# Patient Record
Sex: Male | Born: 1995 | Race: Black or African American | Hispanic: No | Marital: Single | Smoking: Never smoker
Health system: Southern US, Community
[De-identification: ages and names within clinical notes are randomized; demographics above are authoritative.]

---
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
## 2016-08-24 ENCOUNTER — Emergency Department
Admission: EM | Admit: 2016-08-24 | Discharge: 2016-08-24 | Disposition: A | Discharge status: Home / Self Care | Payer: BLUE CROSS/BLUE SHIELD | Attending: Emergency Medicine | Admitting: Emergency Medicine

## 2016-08-24 ENCOUNTER — Encounter: Payer: Self-pay | Admitting: *Deleted

## 2016-08-24 DIAGNOSIS — Y9389 Activity, other specified: Secondary | ICD-10-CM | POA: Insufficient documentation

## 2016-08-24 DIAGNOSIS — M791 Myalgia: Secondary | ICD-10-CM | POA: Diagnosis not present

## 2016-08-24 DIAGNOSIS — Y999 Unspecified external cause status: Secondary | ICD-10-CM | POA: Diagnosis not present

## 2016-08-24 DIAGNOSIS — Y92410 Unspecified street and highway as the place of occurrence of the external cause: Secondary | ICD-10-CM | POA: Insufficient documentation

## 2016-08-24 DIAGNOSIS — M7918 Myalgia, other site: Secondary | ICD-10-CM

## 2016-08-24 DIAGNOSIS — Z041 Encounter for examination and observation following transport accident: Secondary | ICD-10-CM | POA: Diagnosis present

## 2016-08-24 MED ORDER — CYCLOBENZAPRINE HCL 5 MG PO TABS: 5.0000 mg | ORAL_TABLET | Freq: Three times a day (TID) | ORAL | 0 refills | Status: DC | PRN

## 2016-08-24 MED ORDER — NAPROXEN 500 MG PO TBEC: 500.0000 mg | DELAYED_RELEASE_TABLET | Freq: Two times a day (BID) | ORAL | 0 refills | Status: DC

## 2016-08-24 NOTE — Discharge Instructions (Signed)
Your exam is essentially normal at this time. Take the prescription meds as needed. Follow-up with [facility] or your provider for ongoing symptoms.
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
## 2017-03-14 ENCOUNTER — Ambulatory Visit
Admission: EM | Admit: 2017-03-14 | Discharge: 2017-03-14 | Discharge status: Absent without leave | Payer: BLUE CROSS/BLUE SHIELD

## 2018-01-31 ENCOUNTER — Encounter: Payer: Self-pay | Admitting: Emergency Medicine

## 2018-01-31 ENCOUNTER — Emergency Department
Admission: EM | Admit: 2018-01-31 | Discharge: 2018-01-31 | Disposition: A | Discharge status: Home / Self Care | Payer: BLUE CROSS/BLUE SHIELD | Attending: Emergency Medicine | Admitting: Emergency Medicine

## 2018-01-31 DIAGNOSIS — L03012 Cellulitis of left finger: Secondary | ICD-10-CM | POA: Diagnosis not present

## 2018-01-31 DIAGNOSIS — M79642 Pain in left hand: Secondary | ICD-10-CM | POA: Diagnosis present

## 2018-01-31 MED ORDER — PENTAFLUOROPROP-TETRAFLUOROETH EX AERO
INHALATION_SPRAY | CUTANEOUS | Status: AC
  Filled 2018-01-31: qty 30

## 2018-01-31 MED ORDER — PENTAFLUOROPROP-TETRAFLUOROETH EX AERO
INHALATION_SPRAY | Freq: Once | CUTANEOUS | Status: AC
  Administered 2018-01-31: 30 via TOPICAL

## 2018-01-31 MED ORDER — SULFAMETHOXAZOLE-TRIMETHOPRIM 800-160 MG PO TABS: 1.0000 | ORAL_TABLET | Freq: Two times a day (BID) | ORAL | 0 refills | Status: DC

## 2018-01-31 MED ORDER — SULFAMETHOXAZOLE-TRIMETHOPRIM 800-160 MG PO TABS
1.0000 | ORAL_TABLET | Freq: Once | ORAL | Status: AC
  Administered 2018-01-31: 1 via ORAL
  Filled 2018-01-31: qty 1

## 2018-01-31 NOTE — ED Notes (Signed)
Provider at bedside at this time

## 2018-01-31 NOTE — ED Notes (Signed)
PA at bedside for procedure.

## 2018-01-31 NOTE — ED Triage Notes (Signed)
Patient presents to the ED with swelling around the nailbed of his middle finger on his left hand.  Patient states it began to hurt several days ago but did not swell until today.  Patient denies any other complaints at this time.

## 2018-01-31 NOTE — ED Provider Notes (Signed)
[facility] Emergency Department Provider Note  ____________________________________________  Time seen: Approximately 10:20 PM  I have reviewed the triage vital signs and the nursing notes.   HISTORY  Chief Complaint Hand Pain    HPI Sean Herman is a 22 y.o. male who presents the emergency department complaining of pain and swelling to the medial nailbed of the third digit left hand.  Patient reports that he does bite his fingernails, started to experience pain along the medial nailbed.  Today patient had noticeable erythema, what appeared like pus next to the nailbed.  No drainage from site.  Patient reports that pain and swelling is localized only to medial nailbed.  He is able to flex and extend the digit appropriately.  No other injury or complaint.  No medications prior to arrival.    History reviewed. No pertinent past medical history.  There are no active problems to display for this patient.   History reviewed. No pertinent surgical history.  Prior to Admission medications   Medication Sig Start Date End Date Taking? Authorizing Provider  cyclobenzaprine (FLEXERIL) 5 MG tablet Take 1 tablet (5 mg total) by mouth 3 (three) times daily as needed for muscle spasms. 08/24/16   Menshew, Charlesetta Ivory, PA-C  naproxen (EC NAPROSYN) 500 MG EC tablet Take 1 tablet (500 mg total) by mouth 2 (two) times daily with a meal. 08/24/16   Menshew, Charlesetta Ivory, PA-C  sulfamethoxazole-trimethoprim (BACTRIM DS,SEPTRA DS) 800-160 MG tablet Take 1 tablet by mouth 2 (two) times daily. 01/31/18   Lennan Malone, Delorise Royals, PA-C    Allergies Patient has no known allergies.  No family history on file.  Social History Social History   Tobacco Use  . Smoking status: Never Smoker  . Smokeless tobacco: Never Used  Substance Use Topics  . Alcohol use: No  . Drug use: Not on file     Review of Systems  Constitutional: No fever/chills Eyes: No visual changes. No  discharge ENT: No upper respiratory complaints. Cardiovascular: no chest pain. Respiratory: no cough. No SOB. Gastrointestinal: No abdominal pain.  No nausea, no vomiting.  No diarrhea.  No constipation. Musculoskeletal: Negative for musculoskeletal pain. Skin: Positive for pain, swelling, possible medial nailbed third digit left hand Neurological: Negative for headaches, focal weakness or numbness. 10-point ROS otherwise negative.  ____________________________________________   PHYSICAL EXAM:  VITAL SIGNS: ED Triage Vitals  Enc Vitals Group     BP 01/31/18 2108 (!) 154/81     Pulse Rate 01/31/18 2108 74     Resp 01/31/18 2108 18     Temp 01/31/18 2108 (!) 97.5 F (36.4 C)     Temp Source 01/31/18 2108 Oral     SpO2 01/31/18 2108 96 %     Weight 01/31/18 2105 218 lb (98.9 kg)     Height 01/31/18 2105 6\' 1"  (1.854 m)     Head Circumference --      Peak Flow --      Pain Score 01/31/18 2105 8     Pain Loc --      Pain Edu? --      Excl. in GC? --      Constitutional: Alert and oriented. Well appearing and in no acute distress. Eyes: Conjunctivae are normal. PERRL. EOMI. Head: Atraumatic. ENT:      Ears:       Nose: No congestion/rhinnorhea.      Mouth/Throat: Mucous membranes are moist.  Neck: No stridor.    Cardiovascular:  Normal rate, regular rhythm. Normal S1 and S2.  Good peripheral circulation. Respiratory: Normal respiratory effort without tachypnea or retractions. Lungs CTAB. Good air entry to the bases with no decreased or absent breath sounds. Musculoskeletal: Full range of motion to all extremities. No gross deformities appreciated.  Visualization of the third digit left hand reveals mild erythema and edema along the medial nailbed.  Area is fluctuant with induration along the medial nailbed consistent with paronychia.  Erythema and edema does not extend proximally.  No indication of infectious tenderness Vitas.  Full range of motion to the digit.  Sensation and  capillary refill intact. Neurologic:  Normal speech and language. No gross focal neurologic deficits are appreciated.  Skin:  Skin is warm, dry and intact. No rash noted. Psychiatric: Mood and affect are normal. Speech and behavior are normal. Patient exhibits appropriate insight and judgement.   ____________________________________________   LABS (all labs ordered are listed, but only abnormal results are displayed)  Labs Reviewed - No data to display ____________________________________________  EKG   ____________________________________________  RADIOLOGY   No results found.  ____________________________________________    PROCEDURES  Procedure(s) performed:    Marland KitchenMarland KitchenIncision and Drainage Date/Time: 01/31/2018 10:35 PM Performed by: Racheal Patches, PA-C Authorized by: Racheal Patches, PA-C   Consent:    Consent obtained:  Verbal   Consent given by:  Patient   Risks discussed:  Bleeding, incomplete drainage and pain Location:    Indications for incision and drainage: Paronychia.   Location:  Upper extremity   Upper extremity location:  Finger   Finger location:  L long finger Pre-procedure details:    Skin preparation:  Chloraprep Anesthesia (see MAR for exact dosages):    Anesthesia method:  Topical application   Topical anesthesia: Gebauer painease spray. Procedure type:    Complexity:  Simple Procedure details:    Incision types:  Stab incision   Incision depth:  Dermal   Scalpel blade:  11   Wound management:  Probed and deloculated   Drainage:  Purulent   Drainage amount:  Moderate   Wound treatment:  Wound left open   Packing materials:  None Post-procedure details:    Patient tolerance of procedure:  Tolerated well, no immediate complications Comments:     Paronychia to the third digit drained as described above.  No complications.      Medications  pentafluoroprop-tetrafluoroeth (GEBAUERS) aerosol (has no administration in  time range)  pentafluoroprop-tetrafluoroeth (GEBAUERS) aerosol (30 application Topical Given 01/31/18 2226)  sulfamethoxazole-trimethoprim (BACTRIM DS,SEPTRA DS) 800-160 MG per tablet 1 tablet (1 tablet Oral Given 01/31/18 2226)     ____________________________________________   INITIAL IMPRESSION / ASSESSMENT AND PLAN / ED COURSE  Pertinent labs & imaging results that were available during my care of the patient were reviewed by me and considered in my medical decision making (see chart for details).  Review of the [place] CSRS was performed in accordance of the [facility] prior to dispensing any controlled drugs.      Patient's diagnosis is consistent with paronychia.  Patient presented to the emergency department complaining of pain and swelling to the medial aspect of the third digit nailbed.  Exam is consistent with paronychia.  This was drained in the emergency department.  Differential included paronychia, felon's, infectious tenosynovitis.  No indication of worse infection requiring IV antibiotics, labs or imaging.  Patient was given first dose of antibiotic in the emergency department.. Patient will be discharged home with prescriptions for Bactrim. Patient is to follow  up with primary care as needed or otherwise directed. Patient is given ED precautions to return to the ED for any worsening or new symptoms.     ____________________________________________  FINAL CLINICAL IMPRESSION(S) / ED DIAGNOSES  Final diagnoses:  Paronychia of finger of left hand      NEW MEDICATIONS STARTED DURING THIS VISIT:  ED Discharge Orders         Ordered    sulfamethoxazole-trimethoprim (BACTRIM DS,SEPTRA DS) 800-160 MG tablet  2 times daily     01/31/18 2233              This chart was dictated using voice recognition software/Dragon. Despite best efforts to proofread, errors can occur which can change the meaning. Any change was purely unintentional.    Racheal Patches,  PA-C 01/31/18 2236    Jeanmarie Plant, MD 01/31/18 2237

## 2019-02-19 ENCOUNTER — Emergency Department (HOSPITAL_COMMUNITY)
Admission: EM | Admit: 2019-02-19 | Discharge: 2019-02-19 | Disposition: A | Discharge status: Home / Self Care | Payer: BLUE CROSS/BLUE SHIELD

## 2019-02-19 ENCOUNTER — Emergency Department (HOSPITAL_BASED_OUTPATIENT_CLINIC_OR_DEPARTMENT_OTHER)
Admission: EM | Admit: 2019-02-19 | Discharge: 2019-02-19 | Disposition: A | Discharge status: Home / Self Care | Payer: BC Managed Care – PPO | Attending: Emergency Medicine | Admitting: Emergency Medicine

## 2019-02-19 ENCOUNTER — Encounter (HOSPITAL_BASED_OUTPATIENT_CLINIC_OR_DEPARTMENT_OTHER): Payer: Self-pay | Admitting: *Deleted

## 2019-02-19 ENCOUNTER — Other Ambulatory Visit: Payer: Self-pay

## 2019-02-19 DIAGNOSIS — Y9389 Activity, other specified: Secondary | ICD-10-CM | POA: Diagnosis not present

## 2019-02-19 DIAGNOSIS — Y9259 Other trade areas as the place of occurrence of the external cause: Secondary | ICD-10-CM | POA: Insufficient documentation

## 2019-02-19 DIAGNOSIS — Z23 Encounter for immunization: Secondary | ICD-10-CM | POA: Insufficient documentation

## 2019-02-19 DIAGNOSIS — S61012A Laceration without foreign body of left thumb without damage to nail, initial encounter: Secondary | ICD-10-CM

## 2019-02-19 DIAGNOSIS — Y990 Civilian activity done for income or pay: Secondary | ICD-10-CM | POA: Diagnosis not present

## 2019-02-19 DIAGNOSIS — W269XXA Contact with unspecified sharp object(s), initial encounter: Secondary | ICD-10-CM | POA: Insufficient documentation

## 2019-02-19 MED ORDER — LIDOCAINE HCL (PF) 1 % IJ SOLN
10.0000 mL | Freq: Once | INTRAMUSCULAR | Status: AC
  Administered 2019-02-19: 22:00:00 5 mL
  Filled 2019-02-19: qty 10

## 2019-02-19 MED ORDER — TETANUS-DIPHTH-ACELL PERTUSSIS 5-2.5-18.5 LF-MCG/0.5 IM SUSP
0.5000 mL | Freq: Once | INTRAMUSCULAR | Status: AC
  Administered 2019-02-19: 22:00:00 0.5 mL via INTRAMUSCULAR
  Filled 2019-02-19: qty 0.5

## 2019-02-19 NOTE — ED Notes (Signed)
ED Provider at bedside. 

## 2019-02-19 NOTE — ED Provider Notes (Signed)
[facility] EMERGENCY DEPARTMENT Provider Note   CSN: [id number] Arrival date & time: 02/19/19  2058     History   Chief Complaint Chief Complaint  Patient presents with  . Laceration    HPI Sean Herman is a 23 y.o. male who is previously healthy who presents with a laceration to his left thumb.  Patient reports he was trying to cut a piece of plastic at work and sliced his finger instead.  He is full range of motion.  He denies any numbness or tingling.  He is unsure of his tetanus status.  He denies any other injuries.     HPI  History reviewed. No pertinent past medical history.  There are no active problems to display for this patient.   History reviewed. No pertinent surgical history.      Home Medications    Prior to Admission medications   Medication Sig Start Date End Date Taking? Authorizing Provider  cyclobenzaprine (FLEXERIL) 5 MG tablet Take 1 tablet (5 mg total) by mouth 3 (three) times daily as needed for muscle spasms. 08/24/16   Menshew, Charlesetta Ivory, PA-C  naproxen (EC NAPROSYN) 500 MG EC tablet Take 1 tablet (500 mg total) by mouth 2 (two) times daily with a meal. 08/24/16   Menshew, Charlesetta Ivory, PA-C  sulfamethoxazole-trimethoprim (BACTRIM DS,SEPTRA DS) 800-160 MG tablet Take 1 tablet by mouth 2 (two) times daily. 01/31/18   Cuthriell, Delorise Royals, PA-C    Family History No family history on file.  Social History Social History   Tobacco Use  . Smoking status: Never Smoker  . Smokeless tobacco: Never Used  Substance Use Topics  . Alcohol use: No  . Drug use: Not on file     Allergies   Patient has no known allergies.   Review of Systems Review of Systems  Skin: Positive for wound.  Neurological: Negative for numbness.     Physical Exam Updated Vital Signs BP (!) 147/79 (BP Location: Right Arm)   Pulse 74   Temp 98.1 F (36.7 C) (Oral)   Resp 14   Ht 6' (1.829 m)   Wt 97.5 kg   SpO2 100%   BMI 29.16 kg/m    Physical Exam Vitals signs and nursing note reviewed.  Constitutional:      General: He is not in acute distress.    Appearance: He is well-developed. He is not diaphoretic.  HENT:     Head: Normocephalic and atraumatic.     Mouth/Throat:     Pharynx: No oropharyngeal exudate.  Eyes:     General: No scleral icterus.       Right eye: No discharge.        Left eye: No discharge.     Conjunctiva/sclera: Conjunctivae normal.     Pupils: Pupils are equal, round, and reactive to light.  Neck:     Musculoskeletal: Normal range of motion and neck supple.     Thyroid: No thyromegaly.  Cardiovascular:     Rate and Rhythm: Normal rate and regular rhythm.     Heart sounds: Normal heart sounds. No murmur. No friction rub. No gallop.   Pulmonary:     Effort: Pulmonary effort is normal. No respiratory distress.     Breath sounds: Normal breath sounds. No stridor. No wheezing or rales.  Musculoskeletal:     Comments: 2 cm laceration on the radial aspect of the left thumb above the IP joint just lateral to the eponychial fold, just  avoiding the nail Full range of motion of the thumb, sensation intact, cap refill less than 2 seconds  Lymphadenopathy:     Cervical: No cervical adenopathy.  Skin:    General: Skin is warm and dry.     Coloration: Skin is not pale.     Findings: No rash.  Neurological:     Mental Status: He is alert.     Coordination: Coordination normal.      ED Treatments / Results  Labs (all labs ordered are listed, but only abnormal results are displayed) Labs Reviewed - No data to display  EKG None  Radiology No results found.  Procedures .Marland Kitchen.Laceration Repair  Date/Time: 02/19/2019 10:44 PM Performed by: Emi HolesLaw, Noha Karasik M, PA-C Authorized by: Emi HolesLaw, Kiam Bransfield M, PA-C   Consent:    Consent obtained:  Verbal   Consent given by:  Patient   Risks discussed:  Infection and pain Anesthesia (see MAR for exact dosages):    Anesthesia method:  Nerve block   Block  location:  L thumb   Block needle gauge:  25 G   Block anesthetic:  Lidocaine 1% w/o epi   Block technique:  Ring block   Block injection procedure:  Anatomic landmarks identified, introduced needle, incremental injection, anatomic landmarks palpated and negative aspiration for blood   Block outcome:  Incomplete block Laceration details:    Location:  Finger   Finger location:  L thumb   Length (cm):  1.5 Repair type:    Repair type:  Simple Pre-procedure details:    Preparation:  Patient was prepped and draped in usual sterile fashion Exploration:    Hemostasis achieved with:  Direct pressure   Wound exploration: wound explored through full range of motion and entire depth of wound probed and visualized     Wound extent: no foreign bodies/material noted, no muscle damage noted and no tendon damage noted     Contaminated: no   Treatment:    Area cleansed with:  Saline   Amount of cleaning:  Standard   Irrigation solution:  Sterile saline   Irrigation volume:  100   Irrigation method:  Syringe   Visualized foreign bodies/material removed: no   Skin repair:    Repair method:  Sutures   Suture size:  5-0   Wound skin closure material used: Ethilon.   Suture technique:  Simple interrupted   Number of sutures:  4 Approximation:    Approximation:  Close Post-procedure details:    Dressing:  Antibiotic ointment, bulky dressing and splint for protection   Patient tolerance of procedure:  Tolerated well, no immediate complications   (including critical care time)  Medications Ordered in ED Medications  lidocaine (PF) (XYLOCAINE) 1 % injection 10 mL (5 mLs Infiltration Given 02/19/19 2200)  Tdap (BOOSTRIX) injection 0.5 mL (0.5 mLs Intramuscular Given 02/19/19 2159)     Initial Impression / Assessment and Plan / ED Course  I have reviewed the triage vital signs and the nursing notes.  Pertinent labs & imaging results that were available during my care of the patient were  reviewed by me and considered in my medical decision making (see chart for details).        Patient with left thumb laceration avoiding the nail.  Laceration repaired as above.  Unfortunately, patient did not have a complete block and he declined further anesthetic and just wanted to complete the procedure.  No tendon injury noted in bloodless field, full range of motion.  Tetanus updated.  Wound  care discussed.  Return in 10 days for suture removal.  Return precautions discussed including increasing pain, redness, swelling, drainage, red streaking from the wound.  Patient understands and agrees with plan.  Patient vitals stable throughout ED course and discharged in satisfactory condition.  Final Clinical Impressions(s) / ED Diagnoses   Final diagnoses:  Laceration of left thumb without foreign body without damage to nail, initial encounter    ED Discharge Orders    None       Frederica Kuster, PA-C 02/19/19 2248    Margette Fast, MD 02/22/19 [phone]

## 2019-02-19 NOTE — ED Triage Notes (Signed)
Left thumb laceration with a knife.

## 2019-02-19 NOTE — Discharge Instructions (Signed)
Treatment: Keep your wound dry and dressing applied until this time tomorrow. After 24 hours, you may wash with warm soapy water. Dry and apply antibiotic ointment and clean dressing. Do this daily until your sutures are removed. Wear splint for the first 3-4 days, especially when working.  Follow-up: Please follow-up with your primary care provider or return to emergency department in 10 days for suture removal. Be aware of signs of infection: fever, increasing pain, redness, swelling, drainage from the area. Please call your primary care provider or return to emergency department if you develop any of these symptoms or if any of the sutures come out prior to removal. Please return to the emergency department if you develop any other new or worsening symptoms.

## 2019-09-05 ENCOUNTER — Other Ambulatory Visit: Payer: Self-pay

## 2019-09-05 ENCOUNTER — Ambulatory Visit: Payer: BC Managed Care – PPO | Admitting: Physician Assistant

## 2019-09-05 DIAGNOSIS — A5401 Gonococcal cystitis and urethritis, unspecified: Secondary | ICD-10-CM

## 2019-09-05 DIAGNOSIS — Z113 Encounter for screening for infections with a predominantly sexual mode of transmission: Secondary | ICD-10-CM

## 2019-09-05 LAB — GRAM STAIN

## 2019-09-05 MED ORDER — CEFTRIAXONE SODIUM 250 MG IJ SOLR
500.0000 mg | Freq: Once | INTRAMUSCULAR | Status: AC
  Administered 2019-09-05: 500 mg via INTRAMUSCULAR

## 2019-09-05 MED ORDER — DOXYCYCLINE HYCLATE 100 MG PO TABS: 100.0000 mg | ORAL_TABLET | Freq: Two times a day (BID) | ORAL | 0 refills | Status: AC

## 2019-09-06 ENCOUNTER — Encounter: Payer: Self-pay | Admitting: Physician Assistant

## 2019-09-06 NOTE — Progress Notes (Signed)
[facility] Department STI clinic/screening visit  Subjective:  Sean Herman is a 24 y.o. male being seen today for an STI screening visit. The patient reports they do have symptoms.    Patient has the following medical conditions:  There are no problems to display for this patient.    Chief Complaint  Patient presents with  . SEXUALLY TRANSMITTED DISEASE    screening    HPI  Patient reports that he has been having lower abdominal aching pain, dysuria and blood and pus from the urethra for 2 days.  Also, reports some testicular pain this am.  Denies other symptoms, chronic conditions, h/o surgery and regular medicines.   See flowsheet for further details and programmatic requirements.    The following portions of the patient's history were reviewed and updated as appropriate: allergies, current medications, past medical history, past social history, past surgical history and problem list.  Objective:  There were no vitals filed for this visit.  Physical Exam Constitutional:      General: He is not in acute distress.    Appearance: Normal appearance.  HENT:     Head: Normocephalic and atraumatic.     Comments: No nits, lice or hair loss. No cervical, supraclavicular or axillary adenopathy.    Mouth/Throat:     Mouth: Mucous membranes are moist.     Pharynx: Oropharynx is clear. No oropharyngeal exudate or posterior oropharyngeal erythema.  Eyes:     Conjunctiva/sclera: Conjunctivae normal.  Pulmonary:     Effort: Pulmonary effort is normal.  Abdominal:     Palpations: Abdomen is soft. There is no mass.     Tenderness: There is no abdominal tenderness. There is no guarding or rebound.  Genitourinary:    Penis: Normal.      Testes: Normal.     Comments: Pubic area without nits, lice, edema, erythema, lesions and inguinal adenopathy. Penis circumcised, without rash or lesion. Moderate amount of yellowish/brownish discharge at edematous and erythematous  urethral meatus.  Musculoskeletal:     Cervical back: Neck supple. No tenderness.  Skin:    General: Skin is warm and dry.     Findings: No bruising, erythema, lesion or rash.  Neurological:     Mental Status: He is alert and oriented to person, place, and time.  Psychiatric:        Mood and Affect: Mood normal.        Behavior: Behavior normal.        Thought Content: Thought content normal.        Judgment: Judgment normal.       Assessment and Plan:  Sean Herman is a 24 y.o. male presenting to the [facility] Department for STI screening  1. Screening for STD (sexually transmitted disease) Patient into clinic with symptoms. Rec condoms with all sex. Await test results.  Counseled that RN will call if needs to RTC for further treatment once results are back.  Rec that patient follow up with PCP or ER if symptoms do not improve or if they worsen. - Gram stain - HIV [place] - Syphilis Serology, [facility]  2. Gonococcal urethritis in male Will treat for GC and cover for Chlamydia with Ceftriaxone 500 mg IM and Doxycycline 100mg  #14 1 po BD for 7 days. No sex until  7 days after treatment completed and until after partner completes treatment. Call with questions or concerns. - cefTRIAXone (ROCEPHIN) injection 500 mg - doxycycline (VIBRA-TABS) 100  MG tablet; Take 1 tablet (100 mg total) by mouth 2 (two) times daily for 7 days.  Dispense: 14 tablet; Refill: 0     Return in about 3 months (around 12/06/2019).  No future appointments.  Matt Holmes, PA

## 2019-09-07 NOTE — Progress Notes (Signed)
Chart reviewed by Pharmacist  Suzanne Walker PharmD, Contract Pharmacist at [facility]  

## 2019-10-15 ENCOUNTER — Encounter: Payer: Self-pay | Admitting: Physician Assistant

## 2019-10-15 ENCOUNTER — Ambulatory Visit: Payer: BC Managed Care – PPO | Admitting: Physician Assistant

## 2019-10-15 ENCOUNTER — Other Ambulatory Visit: Payer: Self-pay

## 2019-10-15 DIAGNOSIS — N341 Nonspecific urethritis: Secondary | ICD-10-CM

## 2019-10-15 DIAGNOSIS — Z113 Encounter for screening for infections with a predominantly sexual mode of transmission: Secondary | ICD-10-CM

## 2019-10-15 LAB — GRAM STAIN

## 2019-10-15 MED ORDER — AZITHROMYCIN 500 MG PO TABS
1000.0000 mg | ORAL_TABLET | Freq: Once | ORAL | Status: AC
  Administered 2019-10-15: 1000 mg via ORAL

## 2019-10-15 NOTE — Progress Notes (Signed)
Gram stain reviewed and pt treated for NGU per standing order and per provider order. Provider orders completed. 

## 2019-10-15 NOTE — Progress Notes (Signed)
Pt here for STD screening. 

## 2019-10-16 NOTE — Progress Notes (Signed)
   [facility] Department STI clinic/screening visit  Subjective:  Sean Herman is a 24 y.o. male being seen today for an STI screening visit. The patient reports they do not have symptoms.    Patient has the following medical conditions:  There are no problems to display for this patient.    Chief Complaint  Patient presents with  . SEXUALLY TRANSMITTED DISEASE    screening    HPI  Patient reports that he does not have symptoms but would like a screening today.  Reports that his last HIV test was about 2 months ago.  Denies chronic conditions, medications and surgeries.   See flowsheet for further details and programmatic requirements.    The following portions of the patient's history were reviewed and updated as appropriate: allergies, current medications, past medical history, past social history, past surgical history and problem list.  Objective:  There were no vitals filed for this visit.  Physical Exam Constitutional:      General: He is not in acute distress.    Appearance: Normal appearance.  HENT:     Head: Normocephalic and atraumatic.     Comments: No nits, lice, and hair loss. No cervical, supraclavicular or axillary adenopathy.    Mouth/Throat:     Mouth: Mucous membranes are moist.     Pharynx: Oropharynx is clear. No oropharyngeal exudate or posterior oropharyngeal erythema.  Eyes:     Conjunctiva/sclera: Conjunctivae normal.  Pulmonary:     Effort: Pulmonary effort is normal.  Abdominal:     Palpations: Abdomen is soft. There is no mass.     Tenderness: There is no abdominal tenderness. There is no guarding or rebound.  Genitourinary:    Penis: Normal.      Testes: Normal.     Comments: Pubic area without nits, lice, edema, erythema, lesions and inguinal adenopathy. Penis circumcised, without rash, lesions and discharge at meatus. Musculoskeletal:     Cervical back: Neck supple. No tenderness.  Skin:    General: Skin is warm and  dry.     Findings: No bruising, erythema, lesion or rash.  Neurological:     Mental Status: He is alert and oriented to person, place, and time.  Psychiatric:        Mood and Affect: Mood normal.        Behavior: Behavior normal.        Thought Content: Thought content normal.        Judgment: Judgment normal.       Assessment and Plan:  NEFI MUSICH is a 24 y.o. male presenting to the [facility] Department for STI screening  1. Screening for STD (sexually transmitted disease) Patient into clinic without symptoms.  Declines blood work today.  Rec condoms with all sex. Await test results.  Counseled that RN will call if needs to RTC for treatment once results are back. - Gram stain - Gonococcus culture  2. NGU (nongonococcal urethritis) Treat for NGU with Azithromycin 1 g po DOT today. No sex for 7 days and until after partner completes treatment . RTC for re-treatment if vomits < 2 hr after taking medicine. - azithromycin (ZITHROMAX) tablet 1,000 mg     Return for 2-3 weeks for TR's, and PRN.  No future appointments.  Matt Holmes, PA

## 2019-10-20 LAB — GONOCOCCUS CULTURE

## 2021-02-03 ENCOUNTER — Encounter (HOSPITAL_BASED_OUTPATIENT_CLINIC_OR_DEPARTMENT_OTHER): Payer: Self-pay

## 2021-02-03 ENCOUNTER — Other Ambulatory Visit: Payer: Self-pay

## 2021-02-03 ENCOUNTER — Emergency Department (HOSPITAL_BASED_OUTPATIENT_CLINIC_OR_DEPARTMENT_OTHER)
Admission: EM | Admit: 2021-02-03 | Discharge: 2021-02-04 | Disposition: A | Discharge status: Home / Self Care | Payer: No Typology Code available for payment source | Attending: Emergency Medicine | Admitting: Emergency Medicine

## 2021-02-03 DIAGNOSIS — Y92410 Unspecified street and highway as the place of occurrence of the external cause: Secondary | ICD-10-CM | POA: Insufficient documentation

## 2021-02-03 DIAGNOSIS — S161XXA Strain of muscle, fascia and tendon at neck level, initial encounter: Secondary | ICD-10-CM

## 2021-02-03 DIAGNOSIS — S199XXA Unspecified injury of neck, initial encounter: Secondary | ICD-10-CM | POA: Diagnosis present
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
## 2021-02-04 ENCOUNTER — Emergency Department (HOSPITAL_BASED_OUTPATIENT_CLINIC_OR_DEPARTMENT_OTHER): Payer: No Typology Code available for payment source

## 2021-02-04 MED ORDER — CYCLOBENZAPRINE HCL 10 MG PO TABS
10.0000 mg | ORAL_TABLET | Freq: Once | ORAL | Status: AC
  Administered 2021-02-04: 10 mg via ORAL
  Filled 2021-02-04: qty 1

## 2021-02-04 MED ORDER — NAPROXEN 375 MG PO TABS: ORAL_TABLET | ORAL | 0 refills | Status: AC

## 2021-02-04 MED ORDER — NAPROXEN 250 MG PO TABS
500.0000 mg | ORAL_TABLET | Freq: Once | ORAL | Status: AC
  Administered 2021-02-04: 500 mg via ORAL
  Filled 2021-02-04: qty 2

## 2021-02-04 MED ORDER — CYCLOBENZAPRINE HCL 10 MG PO TABS: 10.0000 mg | ORAL_TABLET | Freq: Three times a day (TID) | ORAL | 0 refills | Status: AC | PRN
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
